# Patient Record
Sex: Female | Born: 1960 | Race: White | Hispanic: No | Marital: Married | Smoking: Never smoker
Health system: Southern US, Community
[De-identification: ages and names within clinical notes are randomized; demographics above are authoritative.]

## PROBLEM LIST (undated history)

## (undated) HISTORY — PX: TONSILLECTOMY: SUR1361

## (undated) HISTORY — PX: OTHER SURGICAL HISTORY: SHX169

---

## 2010-09-15 ENCOUNTER — Emergency Department (HOSPITAL_COMMUNITY): Payer: 59

## 2010-09-15 ENCOUNTER — Inpatient Hospital Stay (HOSPITAL_COMMUNITY)
Admission: EM | Admit: 2010-09-15 | Discharge: 2010-09-18 | DRG: 538 | Disposition: A | Discharge status: Home Health | Payer: 59 | Attending: Internal Medicine | Admitting: Internal Medicine

## 2010-09-15 DIAGNOSIS — S7000XA Contusion of unspecified hip, initial encounter: Secondary | ICD-10-CM | POA: Diagnosis present

## 2010-09-15 DIAGNOSIS — D649 Anemia, unspecified: Secondary | ICD-10-CM | POA: Diagnosis present

## 2010-09-15 DIAGNOSIS — IMO0002 Reserved for concepts with insufficient information to code with codable children: Principal | ICD-10-CM | POA: Diagnosis present

## 2010-09-15 DIAGNOSIS — R262 Difficulty in walking, not elsewhere classified: Secondary | ICD-10-CM | POA: Diagnosis present

## 2010-09-15 DIAGNOSIS — W010XXA Fall on same level from slipping, tripping and stumbling without subsequent striking against object, initial encounter: Secondary | ICD-10-CM | POA: Diagnosis present

## 2010-09-15 DIAGNOSIS — M5137 Other intervertebral disc degeneration, lumbosacral region: Secondary | ICD-10-CM | POA: Diagnosis present

## 2010-09-15 DIAGNOSIS — M51379 Other intervertebral disc degeneration, lumbosacral region without mention of lumbar back pain or lower extremity pain: Secondary | ICD-10-CM | POA: Diagnosis present

## 2010-09-16 LAB — CBC
HCT: 31.2 % — ABNORMAL LOW (ref 36.0–46.0)
Hemoglobin: 10.4 g/dL — ABNORMAL LOW (ref 12.0–15.0)
MCV: 84.8 fL (ref 78.0–100.0)
Platelets: 208 10*3/uL (ref 150–400)
RBC: 3.68 MIL/uL — ABNORMAL LOW (ref 3.87–5.11)
WBC: 6.6 10*3/uL (ref 4.0–10.5)

## 2010-09-17 LAB — DIFFERENTIAL
Eosinophils Relative: 0 % (ref 0–5)
Lymphocytes Relative: 17 % (ref 12–46)
Lymphs Abs: 1.5 10*3/uL (ref 0.7–4.0)
Monocytes Relative: 7 % (ref 3–12)

## 2010-09-17 LAB — CBC
HCT: 28.4 % — ABNORMAL LOW (ref 36.0–46.0)
MCV: 85.5 fL (ref 78.0–100.0)
RBC: 3.32 MIL/uL — ABNORMAL LOW (ref 3.87–5.11)
RDW: 14.1 % (ref 11.5–15.5)
WBC: 9.4 10*3/uL (ref 4.0–10.5)

## 2010-10-11 ENCOUNTER — Ambulatory Visit: Payer: 59 | Attending: Orthopedic Surgery

## 2010-10-11 DIAGNOSIS — M25659 Stiffness of unspecified hip, not elsewhere classified: Secondary | ICD-10-CM | POA: Insufficient documentation

## 2010-10-11 DIAGNOSIS — Z96659 Presence of unspecified artificial knee joint: Secondary | ICD-10-CM | POA: Insufficient documentation

## 2010-10-11 DIAGNOSIS — R262 Difficulty in walking, not elsewhere classified: Secondary | ICD-10-CM | POA: Insufficient documentation

## 2010-10-11 DIAGNOSIS — M25559 Pain in unspecified hip: Secondary | ICD-10-CM | POA: Insufficient documentation

## 2010-10-11 DIAGNOSIS — Z029 Encounter for administrative examinations, unspecified: Secondary | ICD-10-CM | POA: Insufficient documentation

## 2010-10-11 DIAGNOSIS — IMO0001 Reserved for inherently not codable concepts without codable children: Secondary | ICD-10-CM | POA: Insufficient documentation

## 2010-10-17 ENCOUNTER — Ambulatory Visit: Payer: 59 | Admitting: Physical Therapy

## 2010-10-17 ENCOUNTER — Encounter: Payer: Self-pay | Admitting: Physical Therapy

## 2010-10-18 ENCOUNTER — Ambulatory Visit: Payer: 59 | Admitting: Physical Therapy

## 2010-10-18 ENCOUNTER — Encounter: Payer: Self-pay | Admitting: Physical Therapy

## 2010-10-23 ENCOUNTER — Ambulatory Visit: Payer: 59 | Admitting: Physical Therapy

## 2010-10-25 ENCOUNTER — Ambulatory Visit: Payer: 59 | Admitting: Physical Therapy

## 2010-10-30 ENCOUNTER — Ambulatory Visit: Payer: 59 | Admitting: Physical Therapy

## 2010-10-31 ENCOUNTER — Ambulatory Visit: Payer: 59 | Admitting: Physical Therapy

## 2010-11-06 ENCOUNTER — Ambulatory Visit: Payer: 59 | Admitting: Physical Therapy

## 2010-11-09 ENCOUNTER — Ambulatory Visit: Payer: 59

## 2010-11-14 ENCOUNTER — Ambulatory Visit: Payer: 59 | Attending: Orthopedic Surgery | Admitting: Physical Therapy

## 2010-11-14 DIAGNOSIS — Z96659 Presence of unspecified artificial knee joint: Secondary | ICD-10-CM | POA: Insufficient documentation

## 2010-11-14 DIAGNOSIS — R262 Difficulty in walking, not elsewhere classified: Secondary | ICD-10-CM | POA: Insufficient documentation

## 2010-11-14 DIAGNOSIS — IMO0001 Reserved for inherently not codable concepts without codable children: Secondary | ICD-10-CM | POA: Insufficient documentation

## 2010-11-14 DIAGNOSIS — M25659 Stiffness of unspecified hip, not elsewhere classified: Secondary | ICD-10-CM | POA: Insufficient documentation

## 2010-11-14 DIAGNOSIS — M25559 Pain in unspecified hip: Secondary | ICD-10-CM | POA: Insufficient documentation

## 2010-11-19 ENCOUNTER — Ambulatory Visit: Payer: 59 | Admitting: Physical Therapy

## 2010-11-20 ENCOUNTER — Ambulatory Visit: Payer: 59 | Admitting: Physical Therapy

## 2010-11-26 ENCOUNTER — Ambulatory Visit: Payer: 59 | Admitting: Physical Therapy

## 2010-11-29 ENCOUNTER — Encounter: Payer: 59 | Admitting: Physical Therapy

## 2010-12-05 ENCOUNTER — Ambulatory Visit: Payer: 59 | Admitting: Physical Therapy

## 2010-12-06 ENCOUNTER — Encounter: Payer: 59 | Admitting: Physical Therapy

## 2013-01-19 ENCOUNTER — Other Ambulatory Visit: Payer: Self-pay | Admitting: Internal Medicine

## 2013-01-19 DIAGNOSIS — Z1231 Encounter for screening mammogram for malignant neoplasm of breast: Secondary | ICD-10-CM

## 2013-02-16 ENCOUNTER — Ambulatory Visit
Admission: RE | Admit: 2013-02-16 | Discharge: 2013-02-16 | Disposition: A | Discharge status: Home / Self Care | Payer: 59 | Source: Ambulatory Visit | Attending: Internal Medicine | Admitting: Internal Medicine

## 2013-02-16 DIAGNOSIS — Z1231 Encounter for screening mammogram for malignant neoplasm of breast: Secondary | ICD-10-CM

## 2013-02-24 ENCOUNTER — Other Ambulatory Visit: Payer: Self-pay | Admitting: Internal Medicine

## 2013-02-24 DIAGNOSIS — R928 Other abnormal and inconclusive findings on diagnostic imaging of breast: Secondary | ICD-10-CM

## 2013-03-05 ENCOUNTER — Ambulatory Visit
Admission: RE | Admit: 2013-03-05 | Discharge: 2013-03-05 | Disposition: A | Discharge status: Home / Self Care | Payer: 59 | Source: Ambulatory Visit | Attending: Internal Medicine | Admitting: Internal Medicine

## 2013-03-05 DIAGNOSIS — R928 Other abnormal and inconclusive findings on diagnostic imaging of breast: Secondary | ICD-10-CM

## 2013-04-09 ENCOUNTER — Ambulatory Visit (AMBULATORY_SURGERY_CENTER): Payer: 59

## 2013-04-09 VITALS — Ht 67.0 in | Wt 194.0 lb

## 2013-04-09 DIAGNOSIS — Z1211 Encounter for screening for malignant neoplasm of colon: Secondary | ICD-10-CM

## 2013-04-09 MED ORDER — MOVIPREP 100 G PO SOLR: 1.0000 | Freq: Once | ORAL | Status: DC

## 2013-04-13 ENCOUNTER — Encounter: Payer: Self-pay | Admitting: Internal Medicine

## 2013-04-23 ENCOUNTER — Ambulatory Visit (AMBULATORY_SURGERY_CENTER): Payer: 59 | Admitting: Internal Medicine

## 2013-04-23 ENCOUNTER — Encounter: Payer: Self-pay | Admitting: Internal Medicine

## 2013-04-23 VITALS — BP 99/68 | HR 45 | Temp 98.5°F | Resp 11 | Ht 67.0 in | Wt 194.0 lb

## 2013-04-23 DIAGNOSIS — Z1211 Encounter for screening for malignant neoplasm of colon: Secondary | ICD-10-CM

## 2013-04-23 MED ORDER — SODIUM CHLORIDE 0.9 % IV SOLN: 500.0000 mL | INTRAVENOUS | Status: DC

## 2013-04-23 NOTE — Op Note (Signed)
[facility] [address] [place], [address]   COLONOSCOPY PROCEDURE REPORT  PATIENT: [name], [name]  MR#: #[id number] BIRTHDATE: Jan 14, 1961 , 51  yrs. old GENDER: Female ENDOSCOPIST: Hart Carwin, MD REFERRED XB:[id number] Tisovec, M.D. PROCEDURE DATE:  04/23/2013 PROCEDURE:   Colonoscopy, screening First Screening Colonoscopy - Avg.  risk and is 50 yrs.  old or older Yes.  Prior Negative Screening - Now for repeat screening. N/A  History of Adenoma - Now for follow-up colonoscopy & has been > or = to 3 yrs.  N/A  Polyps Removed Today? No.  Recommend repeat exam, <10 yrs? No. ASA CLASS:   Class I INDICATIONS:Average risk patient for colon cancer. MEDICATIONS: MAC sedation, administered by CRNA and Propofol (Diprivan) 180 mg IV  DESCRIPTION OF PROCEDURE:   After the risks benefits and alternatives of the procedure were thoroughly explained, informed consent was obtained.  A digital rectal exam revealed no abnormalities of the rectum.   The LB [id number]  endoscope was introduced through the anus and advanced to the cecum, which was identified by both the appendix and ileocecal valve. No adverse events experienced.   The quality of the prep was excellent, using MoviPrep  The instrument was then slowly withdrawn as the colon was fully examined.      COLON FINDINGS: Mild diverticulosis was noted in the sigmoid colon. Retroflexed views revealed no abnormalities. The time to cecum=5 minutes 55 seconds.  Withdrawal time=6 minutes 15 seconds.  The scope was withdrawn and the procedure completed. COMPLICATIONS: There were no complications.  ENDOSCOPIC IMPRESSION: Mild diverticulosis was noted in the sigmoid colon  RECOMMENDATIONS: 1.  High fiber diet 2.   rdecall colonoscopy 10 years   eSigned:  Hart Carwin, MD 04/23/2013 10:16 AM   cc:   PATIENT NAME:  Gail Waters, Gail Waters MR#: #[id number]

## 2013-04-23 NOTE — Progress Notes (Signed)
A/ox3 pleased with MAC, report to Suzanne RN 

## 2013-04-23 NOTE — Patient Instructions (Addendum)
YOU HAD AN ENDOSCOPIC PROCEDURE TODAY AT THE [facility]: Refer to the procedure report that was given to you for any specific questions about what was found during the examination.  If the procedure report does not answer your questions, please call your gastroenterologist to clarify.  If you requested that your care partner not be given the details of your procedure findings, then the procedure report has been included in a sealed envelope for you to review at your convenience later.  YOU SHOULD EXPECT: Some feelings of bloating in the abdomen. Passage of more gas than usual.  Walking can help get rid of the air that was put into your GI tract during the procedure and reduce the bloating. If you had a lower endoscopy (such as a colonoscopy or flexible sigmoidoscopy) you may notice spotting of blood in your stool or on the toilet paper. If you underwent a bowel prep for your procedure, then you may not have a normal bowel movement for a few days.  DIET: Your first meal following the procedure should be a light meal and then it is ok to progress to your normal diet.  A half-sandwich or bowl of soup is an example of a good first meal.  Heavy or fried foods are harder to digest and may make you feel nauseous or bloated.  Likewise meals heavy in dairy and vegetables can cause extra gas to form and this can also increase the bloating.  Drink plenty of fluids but you should avoid alcoholic beverages for 24 hours. Try to eat a high fiber diet to prevent Diverticulitis.  ACTIVITY: Your care partner should take you home directly after the procedure.  You should plan to take it easy, moving slowly for the rest of the day.  You can resume normal activity the day after the procedure however you should NOT DRIVE or use heavy machinery for 24 hours (because of the sedation medicines used during the test).    SYMPTOMS TO REPORT IMMEDIATELY: A gastroenterologist can be reached at any hour.  During normal  business hours, 8:30 AM to 5:00 PM Monday through Friday, call [phone].  After hours and on weekends, please call the GI answering service at [phone] who will take a message and have the physician on call contact you.   Following lower endoscopy (colonoscopy or flexible sigmoidoscopy):  Excessive amounts of blood in the stool  Significant tenderness or worsening of abdominal pains  Swelling of the abdomen that is new, acute  Fever of 100F or higher   FOLLOW UP: If any biopsies were taken you will be contacted by phone or by letter within the next 1-3 weeks.  Call your gastroenterologist if you have not heard about the biopsies in 3 weeks.  Our staff will call the home number listed on your records the next business day following your procedure to check on you and address any questions or concerns that you may have at that time regarding the information given to you following your procedure. This is a courtesy call and so if there is no answer at the home number and we have not heard from you through the emergency physician on call, we will assume that you have returned to your regular daily activities without incident.  SIGNATURES/CONFIDENTIALITY: You and/or your care partner have signed paperwork which will be entered into your electronic medical record.  These signatures attest to the fact that that the information above on your After Visit Summary has been reviewed   and is understood.  Full responsibility of the confidentiality of this discharge information lies with you and/or your care-partner. 

## 2013-04-23 NOTE — Progress Notes (Addendum)
Patient did not have preoperative order for IV antibiotic SSI prophylaxis. (G8918)  Patient did not experience any of the following events: a burn prior to discharge; a fall within the facility; wrong site/side/patient/procedure/implant event; or a hospital transfer or hospital admission upon discharge from the facility. (G8907)  

## 2013-04-26 ENCOUNTER — Telehealth: Payer: Self-pay

## 2013-04-26 NOTE — Telephone Encounter (Signed)
Left a message on the pt's answering machine to check on the pt # [phone]. Maw

## 2013-08-23 ENCOUNTER — Other Ambulatory Visit: Payer: Self-pay | Admitting: Internal Medicine

## 2013-08-23 DIAGNOSIS — N6001 Solitary cyst of right breast: Secondary | ICD-10-CM

## 2013-08-27 ENCOUNTER — Ambulatory Visit
Admission: RE | Admit: 2013-08-27 | Discharge: 2013-08-27 | Disposition: A | Discharge status: Home / Self Care | Payer: 59 | Source: Ambulatory Visit | Attending: Internal Medicine | Admitting: Internal Medicine

## 2013-08-27 DIAGNOSIS — N6001 Solitary cyst of right breast: Secondary | ICD-10-CM

## 2014-02-21 ENCOUNTER — Other Ambulatory Visit: Payer: Self-pay

## 2014-02-21 DIAGNOSIS — Z1231 Encounter for screening mammogram for malignant neoplasm of breast: Secondary | ICD-10-CM

## 2014-02-28 ENCOUNTER — Ambulatory Visit
Admission: RE | Admit: 2014-02-28 | Discharge: 2014-02-28 | Disposition: A | Discharge status: Home / Self Care | Payer: 59 | Source: Ambulatory Visit

## 2014-02-28 ENCOUNTER — Encounter (INDEPENDENT_AMBULATORY_CARE_PROVIDER_SITE_OTHER): Payer: Self-pay

## 2014-02-28 DIAGNOSIS — Z1231 Encounter for screening mammogram for malignant neoplasm of breast: Secondary | ICD-10-CM

## 2016-08-19 DIAGNOSIS — R5383 Other fatigue: Secondary | ICD-10-CM | POA: Diagnosis not present

## 2016-08-19 DIAGNOSIS — R202 Paresthesia of skin: Secondary | ICD-10-CM | POA: Diagnosis not present

## 2016-08-19 DIAGNOSIS — R0789 Other chest pain: Secondary | ICD-10-CM | POA: Diagnosis not present

## 2016-09-09 DIAGNOSIS — Z23 Encounter for immunization: Secondary | ICD-10-CM | POA: Diagnosis not present

## 2016-09-12 DIAGNOSIS — Z0000 Encounter for general adult medical examination without abnormal findings: Secondary | ICD-10-CM | POA: Diagnosis not present

## 2016-09-18 DIAGNOSIS — Z124 Encounter for screening for malignant neoplasm of cervix: Secondary | ICD-10-CM | POA: Diagnosis not present

## 2016-09-18 DIAGNOSIS — Z0000 Encounter for general adult medical examination without abnormal findings: Secondary | ICD-10-CM | POA: Diagnosis not present

## 2016-09-25 ENCOUNTER — Other Ambulatory Visit: Payer: Self-pay | Admitting: Internal Medicine

## 2016-09-25 DIAGNOSIS — Z1231 Encounter for screening mammogram for malignant neoplasm of breast: Secondary | ICD-10-CM

## 2016-10-10 ENCOUNTER — Ambulatory Visit
Admission: RE | Admit: 2016-10-10 | Discharge: 2016-10-10 | Disposition: A | Discharge status: Home / Self Care | Payer: Commercial Managed Care - HMO | Source: Ambulatory Visit | Attending: Internal Medicine | Admitting: Internal Medicine

## 2016-10-10 DIAGNOSIS — Z1231 Encounter for screening mammogram for malignant neoplasm of breast: Secondary | ICD-10-CM

## 2017-01-19 DIAGNOSIS — J209 Acute bronchitis, unspecified: Secondary | ICD-10-CM | POA: Diagnosis not present

## 2017-06-19 DIAGNOSIS — M1711 Unilateral primary osteoarthritis, right knee: Secondary | ICD-10-CM | POA: Diagnosis not present

## 2017-10-06 DIAGNOSIS — M5432 Sciatica, left side: Secondary | ICD-10-CM | POA: Diagnosis not present

## 2017-10-06 DIAGNOSIS — M546 Pain in thoracic spine: Secondary | ICD-10-CM | POA: Diagnosis not present

## 2017-10-09 DIAGNOSIS — M546 Pain in thoracic spine: Secondary | ICD-10-CM | POA: Diagnosis not present

## 2017-10-09 DIAGNOSIS — M9903 Segmental and somatic dysfunction of lumbar region: Secondary | ICD-10-CM | POA: Diagnosis not present

## 2017-10-09 DIAGNOSIS — M5432 Sciatica, left side: Secondary | ICD-10-CM | POA: Diagnosis not present

## 2018-03-05 DIAGNOSIS — M9903 Segmental and somatic dysfunction of lumbar region: Secondary | ICD-10-CM | POA: Diagnosis not present

## 2018-03-05 DIAGNOSIS — M545 Low back pain: Secondary | ICD-10-CM | POA: Diagnosis not present

## 2018-03-05 DIAGNOSIS — M9904 Segmental and somatic dysfunction of sacral region: Secondary | ICD-10-CM | POA: Diagnosis not present

## 2018-03-12 DIAGNOSIS — M9904 Segmental and somatic dysfunction of sacral region: Secondary | ICD-10-CM | POA: Diagnosis not present

## 2018-03-12 DIAGNOSIS — M545 Low back pain: Secondary | ICD-10-CM | POA: Diagnosis not present

## 2018-03-12 DIAGNOSIS — M9903 Segmental and somatic dysfunction of lumbar region: Secondary | ICD-10-CM | POA: Diagnosis not present

## 2018-03-12 DIAGNOSIS — R51 Headache: Secondary | ICD-10-CM | POA: Diagnosis not present

## 2018-03-16 DIAGNOSIS — M5432 Sciatica, left side: Secondary | ICD-10-CM | POA: Diagnosis not present

## 2018-03-16 DIAGNOSIS — M545 Low back pain: Secondary | ICD-10-CM | POA: Diagnosis not present

## 2018-03-16 DIAGNOSIS — M79652 Pain in left thigh: Secondary | ICD-10-CM | POA: Diagnosis not present

## 2018-03-16 DIAGNOSIS — M549 Dorsalgia, unspecified: Secondary | ICD-10-CM | POA: Diagnosis not present

## 2018-03-16 DIAGNOSIS — M47896 Other spondylosis, lumbar region: Secondary | ICD-10-CM | POA: Diagnosis not present

## 2018-03-16 DIAGNOSIS — M5442 Lumbago with sciatica, left side: Secondary | ICD-10-CM | POA: Diagnosis not present

## 2018-03-16 DIAGNOSIS — Z791 Long term (current) use of non-steroidal anti-inflammatories (NSAID): Secondary | ICD-10-CM | POA: Diagnosis not present

## 2018-03-16 DIAGNOSIS — M9904 Segmental and somatic dysfunction of sacral region: Secondary | ICD-10-CM | POA: Diagnosis not present

## 2018-03-16 DIAGNOSIS — M9903 Segmental and somatic dysfunction of lumbar region: Secondary | ICD-10-CM | POA: Diagnosis not present

## 2018-03-17 DIAGNOSIS — M549 Dorsalgia, unspecified: Secondary | ICD-10-CM | POA: Diagnosis not present

## 2018-03-17 DIAGNOSIS — M9904 Segmental and somatic dysfunction of sacral region: Secondary | ICD-10-CM | POA: Diagnosis not present

## 2018-03-17 DIAGNOSIS — M545 Low back pain: Secondary | ICD-10-CM | POA: Diagnosis not present

## 2018-03-17 DIAGNOSIS — M47896 Other spondylosis, lumbar region: Secondary | ICD-10-CM | POA: Diagnosis not present

## 2018-03-17 DIAGNOSIS — M9903 Segmental and somatic dysfunction of lumbar region: Secondary | ICD-10-CM | POA: Diagnosis not present

## 2018-03-19 DIAGNOSIS — M9903 Segmental and somatic dysfunction of lumbar region: Secondary | ICD-10-CM | POA: Diagnosis not present

## 2018-03-19 DIAGNOSIS — M545 Low back pain: Secondary | ICD-10-CM | POA: Diagnosis not present

## 2018-03-19 DIAGNOSIS — M9904 Segmental and somatic dysfunction of sacral region: Secondary | ICD-10-CM | POA: Diagnosis not present

## 2018-03-26 DIAGNOSIS — M545 Low back pain: Secondary | ICD-10-CM | POA: Diagnosis not present

## 2018-03-26 DIAGNOSIS — M25552 Pain in left hip: Secondary | ICD-10-CM | POA: Diagnosis not present

## 2018-03-27 DIAGNOSIS — M545 Low back pain: Secondary | ICD-10-CM | POA: Diagnosis not present

## 2018-03-31 DIAGNOSIS — M5442 Lumbago with sciatica, left side: Secondary | ICD-10-CM | POA: Diagnosis not present

## 2018-04-03 DIAGNOSIS — M5416 Radiculopathy, lumbar region: Secondary | ICD-10-CM | POA: Diagnosis not present

## 2018-04-06 DIAGNOSIS — M5416 Radiculopathy, lumbar region: Secondary | ICD-10-CM | POA: Diagnosis not present

## 2018-04-07 DIAGNOSIS — M5416 Radiculopathy, lumbar region: Secondary | ICD-10-CM | POA: Diagnosis not present

## 2018-04-09 DIAGNOSIS — M5416 Radiculopathy, lumbar region: Secondary | ICD-10-CM | POA: Diagnosis not present

## 2018-04-22 DIAGNOSIS — M5416 Radiculopathy, lumbar region: Secondary | ICD-10-CM | POA: Diagnosis not present

## 2018-04-24 DIAGNOSIS — M5416 Radiculopathy, lumbar region: Secondary | ICD-10-CM | POA: Diagnosis not present

## 2018-04-27 DIAGNOSIS — M5416 Radiculopathy, lumbar region: Secondary | ICD-10-CM | POA: Diagnosis not present

## 2018-05-01 DIAGNOSIS — M5416 Radiculopathy, lumbar region: Secondary | ICD-10-CM | POA: Diagnosis not present

## 2018-06-09 DIAGNOSIS — M1711 Unilateral primary osteoarthritis, right knee: Secondary | ICD-10-CM | POA: Diagnosis not present

## 2018-06-10 DIAGNOSIS — Z0000 Encounter for general adult medical examination without abnormal findings: Secondary | ICD-10-CM | POA: Diagnosis not present

## 2018-06-18 DIAGNOSIS — R82998 Other abnormal findings in urine: Secondary | ICD-10-CM | POA: Diagnosis not present

## 2018-06-18 DIAGNOSIS — H8113 Benign paroxysmal vertigo, bilateral: Secondary | ICD-10-CM | POA: Diagnosis not present

## 2018-06-18 DIAGNOSIS — M545 Low back pain: Secondary | ICD-10-CM | POA: Diagnosis not present

## 2018-06-18 DIAGNOSIS — Z0000 Encounter for general adult medical examination without abnormal findings: Secondary | ICD-10-CM | POA: Diagnosis not present

## 2018-06-18 DIAGNOSIS — R0789 Other chest pain: Secondary | ICD-10-CM | POA: Diagnosis not present

## 2018-06-18 DIAGNOSIS — Z1389 Encounter for screening for other disorder: Secondary | ICD-10-CM | POA: Diagnosis not present

## 2018-06-23 ENCOUNTER — Other Ambulatory Visit: Payer: Self-pay | Admitting: Internal Medicine

## 2018-06-23 DIAGNOSIS — Z1231 Encounter for screening mammogram for malignant neoplasm of breast: Secondary | ICD-10-CM

## 2018-08-10 ENCOUNTER — Ambulatory Visit: Payer: Commercial Managed Care - HMO

## 2018-08-19 DIAGNOSIS — M545 Low back pain: Secondary | ICD-10-CM | POA: Diagnosis not present

## 2018-09-10 ENCOUNTER — Ambulatory Visit
Admission: RE | Admit: 2018-09-10 | Discharge: 2018-09-10 | Disposition: A | Discharge status: Home / Self Care | Payer: 59 | Source: Ambulatory Visit | Attending: Internal Medicine | Admitting: Internal Medicine

## 2018-09-10 DIAGNOSIS — Z1231 Encounter for screening mammogram for malignant neoplasm of breast: Secondary | ICD-10-CM

## 2019-10-21 ENCOUNTER — Ambulatory Visit: Payer: 59 | Attending: Internal Medicine

## 2019-10-21 DIAGNOSIS — Z23 Encounter for immunization: Secondary | ICD-10-CM

## 2019-10-21 NOTE — Progress Notes (Signed)
   Covid-19 Vaccination Clinic  Name:  Gail Waters    MRN: [id number] DOB: 1961/02/02  10/21/2019  [name] was observed post Covid-19 immunization for 15 minutes without incident. She was provided with Vaccine Information Sheet and instruction to access the V-Safe system.   [name] was instructed to call 911 with any severe reactions post vaccine: Marland Kitchen Difficulty breathing  . Swelling of face and throat  . A fast heartbeat  . A bad rash all over body  . Dizziness and weakness   Immunizations Administered    Name Date Dose VIS Date Route   Pfizer COVID-19 Vaccine 10/21/2019 10:09 AM 0.3 mL 07/23/2019 Intramuscular   Manufacturer: [employer], [employer]   Lot: [id number]   NDC: [address]-7

## 2019-11-17 ENCOUNTER — Ambulatory Visit: Payer: 59 | Attending: Internal Medicine

## 2019-11-17 DIAGNOSIS — Z23 Encounter for immunization: Secondary | ICD-10-CM

## 2019-11-17 NOTE — Progress Notes (Signed)
2  Covid-19 Vaccination Clinic  Name:  Gail Waters    MRN: [id number] DOB: 06/13/1961  11/17/2019  Gail Waters was observed post Covid-19 immunization for 15 minutes without incident. She was provided with Vaccine Information Sheet and instruction to access the V-Safe system.   Gail Waters was instructed to call 911 with any severe reactions post vaccine: Marland Kitchen Difficulty breathing  . Swelling of face and throat  . A fast heartbeat  . A bad rash all over body  . Dizziness and weakness   Immunizations Administered    Name Date Dose VIS Date Route   Pfizer COVID-19 Vaccine 11/17/2019  8:14 AM 0.3 mL 07/23/2019 Intramuscular   Manufacturer: [employer], [employer]   Lot: [id number]   NDC: [address]-7

## 2020-03-11 IMAGING — MG DIGITAL SCREENING BILATERAL MAMMOGRAM WITH TOMO AND CAD
8 series · 8 of 24 positions shown · non-contrast
Comparison: Previous exam(s).

CLINICAL DATA: Screening.

EXAM:
DIGITAL SCREENING BILATERAL MAMMOGRAM WITH TOMO AND CAD

[R CC synth-2D]
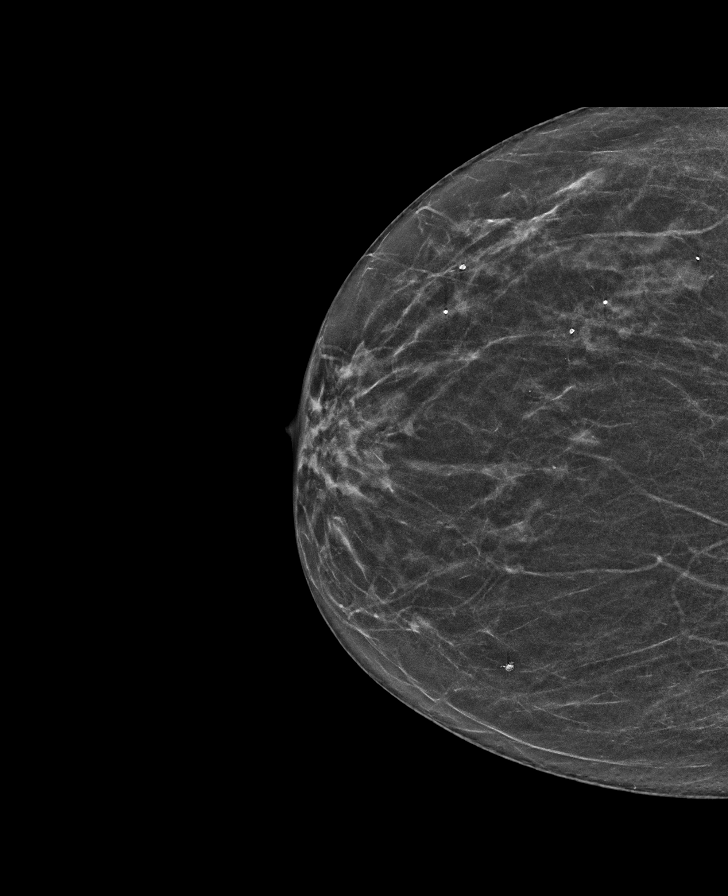

[L MLO synth-2D]
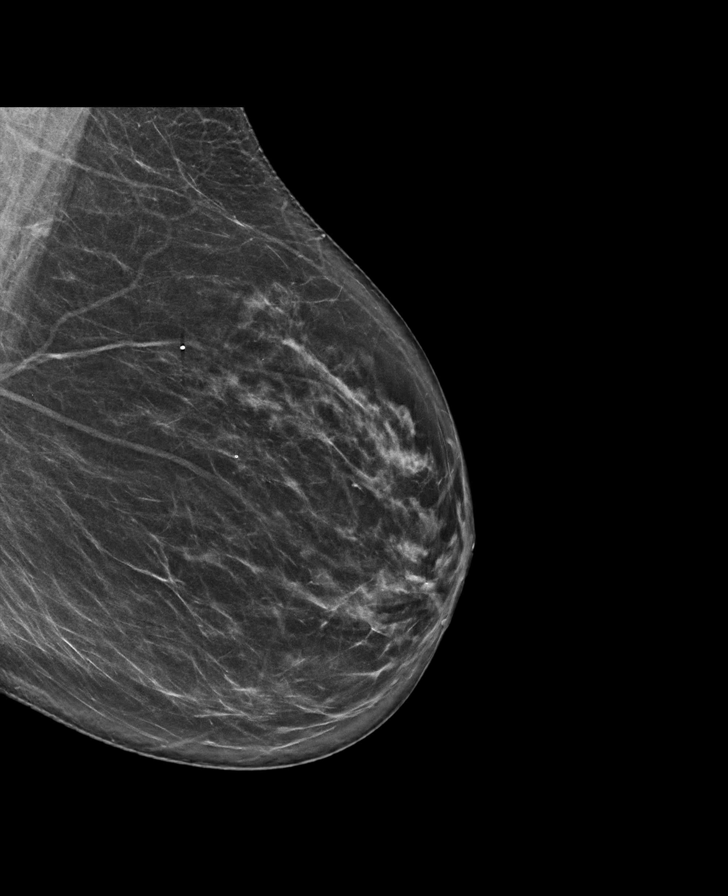

[L CC synth-2D]
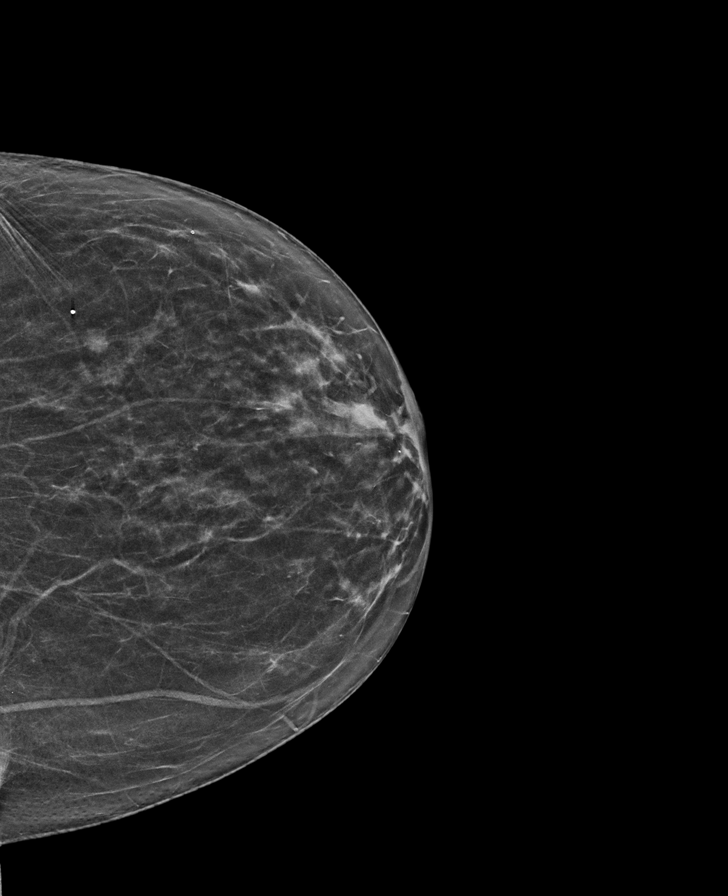

[R MLO synth-2D]
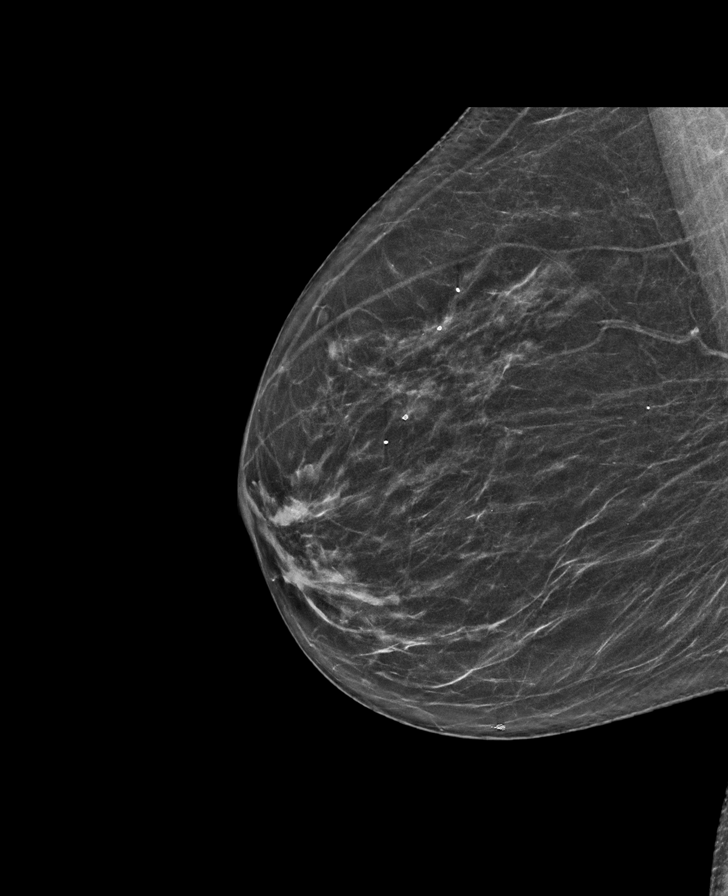

[L MLO tomo · tomo slice 37/73.0]
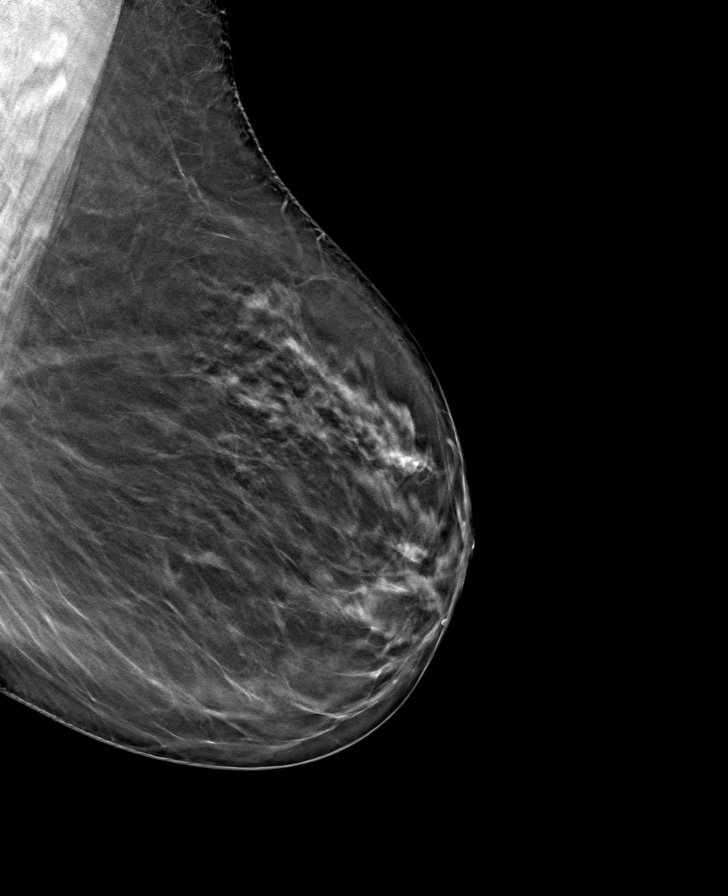

[R MLO tomo · tomo slice 33/64.0]
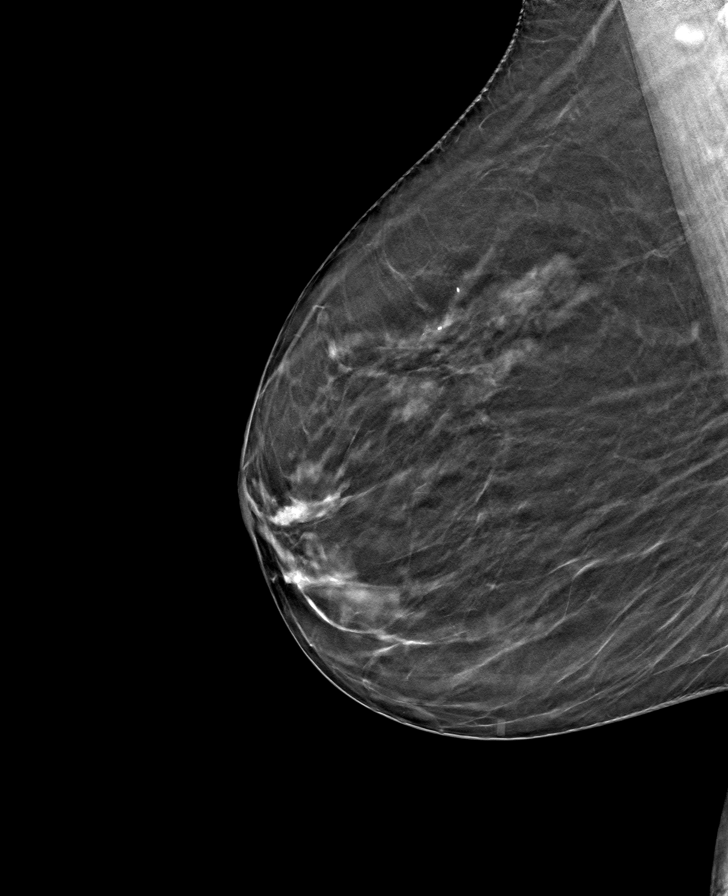

[L CC tomo · tomo slice 31/62.0]
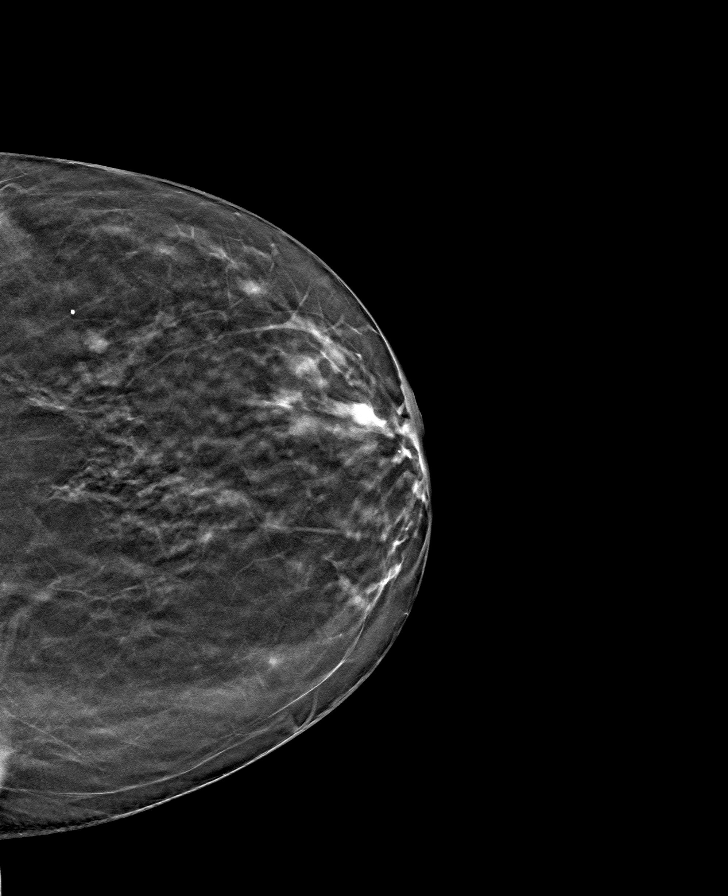

[R CC tomo · tomo slice 29/57.0]
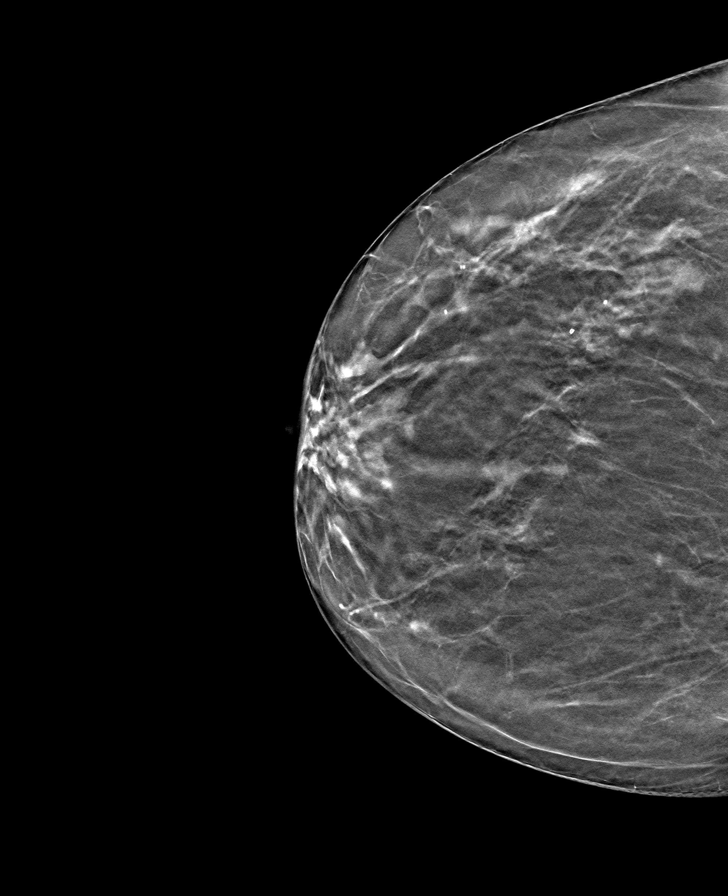

[8 of 24 positions shown; findings below may reference images not displayed]

ACR Breast Density Category b: There are scattered areas of
fibroglandular density.
FINDINGS: There are no findings suspicious for malignancy. Images were
processed with CAD.
IMPRESSION: No mammographic evidence of malignancy. A result letter of this
screening mammogram will be mailed directly to the patient.

RECOMMENDATION:
Screening mammogram in one year. (Code:[id number])

BI-RADS CATEGORY  1: Negative.

## 2020-09-05 ENCOUNTER — Other Ambulatory Visit: Payer: Self-pay | Admitting: Internal Medicine

## 2020-09-05 DIAGNOSIS — Z0000 Encounter for general adult medical examination without abnormal findings: Secondary | ICD-10-CM

## 2021-10-17 ENCOUNTER — Other Ambulatory Visit: Payer: Self-pay | Admitting: Internal Medicine

## 2021-10-17 DIAGNOSIS — Z1231 Encounter for screening mammogram for malignant neoplasm of breast: Secondary | ICD-10-CM

## 2021-12-13 ENCOUNTER — Ambulatory Visit: Payer: 59

## 2021-12-18 ENCOUNTER — Ambulatory Visit: Payer: 59

## 2021-12-20 ENCOUNTER — Ambulatory Visit
Admission: RE | Admit: 2021-12-20 | Discharge: 2021-12-20 | Disposition: A | Discharge status: Home / Self Care | Payer: 59 | Source: Ambulatory Visit | Attending: Internal Medicine | Admitting: Internal Medicine

## 2021-12-20 DIAGNOSIS — Z1231 Encounter for screening mammogram for malignant neoplasm of breast: Secondary | ICD-10-CM
